# Patient Record
Sex: Female | Born: 1990 | Race: White | Hispanic: No | Smoking: Never smoker
Health system: Southern US, Community
[De-identification: ages and names within clinical notes are randomized; demographics above are authoritative.]

## PROBLEM LIST (undated history)

## (undated) DIAGNOSIS — J45909 Unspecified asthma, uncomplicated: Secondary | ICD-10-CM

## (undated) DIAGNOSIS — F419 Anxiety disorder, unspecified: Secondary | ICD-10-CM

## (undated) DIAGNOSIS — F988 Other specified behavioral and emotional disorders with onset usually occurring in childhood and adolescence: Secondary | ICD-10-CM

## (undated) HISTORY — PX: ADENOIDECTOMY: SUR15

## (undated) HISTORY — PX: ORTHOPEDIC SURGERY: SHX850

## (undated) HISTORY — PX: TUBAL LIGATION: SHX77

---

## 2017-04-05 ENCOUNTER — Emergency Department (HOSPITAL_COMMUNITY): Payer: No Typology Code available for payment source

## 2017-04-05 ENCOUNTER — Other Ambulatory Visit: Payer: Self-pay

## 2017-04-05 ENCOUNTER — Emergency Department (HOSPITAL_COMMUNITY)
Admission: EM | Admit: 2017-04-05 | Discharge: 2017-04-05 | Disposition: A | Discharge status: Home / Self Care | Payer: No Typology Code available for payment source | Attending: Emergency Medicine | Admitting: Emergency Medicine

## 2017-04-05 ENCOUNTER — Encounter (HOSPITAL_COMMUNITY): Payer: Self-pay

## 2017-04-05 DIAGNOSIS — S22089A Unspecified fracture of T11-T12 vertebra, initial encounter for closed fracture: Secondary | ICD-10-CM | POA: Diagnosis not present

## 2017-04-05 DIAGNOSIS — R0789 Other chest pain: Secondary | ICD-10-CM | POA: Diagnosis not present

## 2017-04-05 DIAGNOSIS — Y939 Activity, unspecified: Secondary | ICD-10-CM | POA: Diagnosis not present

## 2017-04-05 DIAGNOSIS — R1084 Generalized abdominal pain: Secondary | ICD-10-CM | POA: Diagnosis not present

## 2017-04-05 DIAGNOSIS — J45909 Unspecified asthma, uncomplicated: Secondary | ICD-10-CM | POA: Insufficient documentation

## 2017-04-05 DIAGNOSIS — Y999 Unspecified external cause status: Secondary | ICD-10-CM | POA: Insufficient documentation

## 2017-04-05 DIAGNOSIS — M542 Cervicalgia: Secondary | ICD-10-CM | POA: Insufficient documentation

## 2017-04-05 DIAGNOSIS — Y92410 Unspecified street and highway as the place of occurrence of the external cause: Secondary | ICD-10-CM | POA: Insufficient documentation

## 2017-04-05 DIAGNOSIS — W2210XA Striking against or struck by unspecified automobile airbag, initial encounter: Secondary | ICD-10-CM | POA: Insufficient documentation

## 2017-04-05 DIAGNOSIS — S299XXA Unspecified injury of thorax, initial encounter: Secondary | ICD-10-CM | POA: Diagnosis present

## 2017-04-05 DIAGNOSIS — S22089D Unspecified fracture of T11-T12 vertebra, subsequent encounter for fracture with routine healing: Secondary | ICD-10-CM

## 2017-04-05 HISTORY — DX: Other specified behavioral and emotional disorders with onset usually occurring in childhood and adolescence: F98.8

## 2017-04-05 HISTORY — DX: Unspecified asthma, uncomplicated: J45.909

## 2017-04-05 HISTORY — DX: Anxiety disorder, unspecified: F41.9

## 2017-04-05 LAB — CBC
HEMATOCRIT: 38.6 % (ref 36.0–46.0)
Hemoglobin: 12.5 g/dL (ref 12.0–15.0)
MCH: 30.4 pg (ref 26.0–34.0)
MCHC: 32.4 g/dL (ref 30.0–36.0)
MCV: 93.9 fL (ref 78.0–100.0)
Platelets: 263 10*3/uL (ref 150–400)
RBC: 4.11 MIL/uL (ref 3.87–5.11)
RDW: 12.4 % (ref 11.5–15.5)
WBC: 8.5 10*3/uL (ref 4.0–10.5)

## 2017-04-05 LAB — BASIC METABOLIC PANEL
ANION GAP: 10 (ref 5–15)
BUN: 20 mg/dL (ref 6–20)
CALCIUM: 9 mg/dL (ref 8.9–10.3)
CO2: 22 mmol/L (ref 22–32)
Chloride: 105 mmol/L (ref 101–111)
Creatinine, Ser: 0.66 mg/dL (ref 0.44–1.00)
GFR calc Af Amer: >60 mL/min (ref >60)
GFR calc non Af Amer: >60 mL/min (ref >60)
GLUCOSE: 98 mg/dL (ref 65–99)
Potassium: 3.9 mmol/L (ref 3.5–5.1)
Sodium: 137 mmol/L (ref 135–145)

## 2017-04-05 LAB — I-STAT BETA HCG BLOOD, ED (MC, WL, AP ONLY): I-stat hCG, quantitative: <5 m[IU]/mL (ref <5)

## 2017-04-05 MED ORDER — MORPHINE SULFATE (PF) 2 MG/ML IV SOLN
INTRAVENOUS | Status: AC
  Administered 2017-04-05: 2 mg via INTRAVENOUS
  Filled 2017-04-05: qty 1

## 2017-04-05 MED ORDER — HYDROCODONE-ACETAMINOPHEN 5-325 MG PO TABS: 2.0000 | ORAL_TABLET | ORAL | 0 refills | Status: AC | PRN

## 2017-04-05 MED ORDER — IOPAMIDOL (ISOVUE-300) INJECTION 61%
100.0000 mL | Freq: Once | INTRAVENOUS | Status: AC | PRN
  Administered 2017-04-05: 100 mL via INTRAVENOUS

## 2017-04-05 MED ORDER — MORPHINE SULFATE (PF) 4 MG/ML IV SOLN: 2.0000 mg | Freq: Once | INTRAVENOUS | Status: DC
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
## 2017-04-05 NOTE — Progress Notes (Signed)
1 pair earring, necklace with charm and belly button ring placed in ziploc bag and given to patient

## 2017-04-05 NOTE — Discharge Instructions (Signed)
Please see your spinal doctor within the next week.  I would encourage you to have plenty of rest over the next several days until you follow-up with the spinal doctor.  I have given you a copy of your CT scan on the disc, please take this with you.  Hydrocodone, 6 tablets have been dispensed, take 1 tablet every 6 hours as needed but please take ibuprofen up to 600 mg every 6 hours as well.  Emergency department evaluation immediately for severe or worsening pain in the back, or numbness or weakness of the legs
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
## 2017-04-07 MED FILL — Hydrocodone-Acetaminophen Tab 5-325 MG: ORAL | Qty: 6 | Status: AC
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
## 2019-09-03 ENCOUNTER — Emergency Department (HOSPITAL_COMMUNITY): Payer: Medicaid Other

## 2019-09-03 ENCOUNTER — Emergency Department (HOSPITAL_COMMUNITY)
Admission: EM | Admit: 2019-09-03 | Discharge: 2019-09-03 | Disposition: A | Discharge status: Home / Self Care | Payer: Medicaid Other | Attending: Emergency Medicine | Admitting: Emergency Medicine

## 2019-09-03 ENCOUNTER — Encounter (HOSPITAL_COMMUNITY): Payer: Self-pay | Admitting: Emergency Medicine

## 2019-09-03 DIAGNOSIS — Z5321 Procedure and treatment not carried out due to patient leaving prior to being seen by health care provider: Secondary | ICD-10-CM | POA: Diagnosis not present

## 2019-09-03 DIAGNOSIS — M25571 Pain in right ankle and joints of right foot: Secondary | ICD-10-CM | POA: Diagnosis not present

## 2019-09-03 DIAGNOSIS — R109 Unspecified abdominal pain: Secondary | ICD-10-CM | POA: Diagnosis present
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
